# Patient Record
Sex: Male | Born: 1980 | Race: Black or African American | Hispanic: No | Marital: Married | Smoking: Never smoker
Health system: Southern US, Community
[De-identification: ages and names within clinical notes are randomized; demographics above are authoritative.]

## PROBLEM LIST (undated history)

## (undated) HISTORY — PX: ROTATOR CUFF REPAIR: SHX139

---

## 2017-02-10 ENCOUNTER — Emergency Department: Payer: BLUE CROSS/BLUE SHIELD

## 2017-02-10 ENCOUNTER — Emergency Department
Admission: EM | Admit: 2017-02-10 | Discharge: 2017-02-10 | Disposition: A | Discharge status: Home / Self Care | Payer: BLUE CROSS/BLUE SHIELD | Attending: Student in an Organized Health Care Education/Training Program | Admitting: Student in an Organized Health Care Education/Training Program

## 2017-02-10 DIAGNOSIS — G44319 Acute post-traumatic headache, not intractable: Secondary | ICD-10-CM | POA: Diagnosis not present

## 2017-02-10 DIAGNOSIS — M542 Cervicalgia: Secondary | ICD-10-CM | POA: Diagnosis not present

## 2017-02-10 DIAGNOSIS — Y929 Unspecified place or not applicable: Secondary | ICD-10-CM | POA: Diagnosis not present

## 2017-02-10 DIAGNOSIS — S59911A Unspecified injury of right forearm, initial encounter: Secondary | ICD-10-CM | POA: Diagnosis present

## 2017-02-10 DIAGNOSIS — M791 Myalgia: Secondary | ICD-10-CM | POA: Insufficient documentation

## 2017-02-10 DIAGNOSIS — Y9389 Activity, other specified: Secondary | ICD-10-CM | POA: Diagnosis not present

## 2017-02-10 DIAGNOSIS — Y999 Unspecified external cause status: Secondary | ICD-10-CM | POA: Diagnosis not present

## 2017-02-10 DIAGNOSIS — M7918 Myalgia, other site: Secondary | ICD-10-CM

## 2017-02-10 DIAGNOSIS — S52124A Nondisplaced fracture of head of right radius, initial encounter for closed fracture: Secondary | ICD-10-CM | POA: Diagnosis not present

## 2017-02-10 MED ORDER — BUTALBITAL-APAP-CAFFEINE 50-325-40 MG PO TABS: 1.0000 | ORAL_TABLET | Freq: Four times a day (QID) | ORAL | 0 refills | Status: DC | PRN

## 2017-02-10 MED ORDER — BUTALBITAL-APAP-CAFFEINE 50-325-40 MG PO TABS
ORAL_TABLET | ORAL | Status: AC
  Administered 2017-02-10: 2 via ORAL
  Filled 2017-02-10: qty 2

## 2017-02-10 MED ORDER — PROCHLORPERAZINE EDISYLATE 5 MG/ML IJ SOLN
10.0000 mg | Freq: Once | INTRAMUSCULAR | Status: AC
  Administered 2017-02-10: 10 mg via INTRAMUSCULAR
  Filled 2017-02-10: qty 2

## 2017-02-10 MED ORDER — PROCHLORPERAZINE EDISYLATE 5 MG/ML IJ SOLN
INTRAMUSCULAR | Status: AC
  Administered 2017-02-10: 10 mg via INTRAMUSCULAR
  Filled 2017-02-10: qty 2

## 2017-02-10 MED ORDER — OXYCODONE HCL 5 MG PO TABS: 5.0000 mg | ORAL_TABLET | Freq: Three times a day (TID) | ORAL | 0 refills | Status: AC | PRN

## 2017-02-10 MED ORDER — BUTALBITAL-APAP-CAFFEINE 50-325-40 MG PO TABS
2.0000 | ORAL_TABLET | ORAL | Status: DC | PRN
  Administered 2017-02-10: 2 via ORAL

## 2017-02-10 MED ORDER — CYCLOBENZAPRINE HCL 10 MG PO TABS: 10.0000 mg | ORAL_TABLET | Freq: Three times a day (TID) | ORAL | 0 refills | Status: DC | PRN

## 2017-02-10 NOTE — ED Notes (Signed)
X-ray at bedside
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
## 2017-02-10 NOTE — ED Notes (Signed)
Patient transported to CT 

## 2017-03-11 ENCOUNTER — Encounter: Payer: Self-pay | Admitting: Emergency Medicine

## 2017-03-11 ENCOUNTER — Emergency Department
Admission: EM | Admit: 2017-03-11 | Discharge: 2017-03-11 | Disposition: A | Discharge status: Home / Self Care | Payer: BLUE CROSS/BLUE SHIELD | Attending: Emergency Medicine | Admitting: Emergency Medicine

## 2017-03-11 DIAGNOSIS — F0781 Postconcussional syndrome: Secondary | ICD-10-CM

## 2017-03-11 DIAGNOSIS — R51 Headache: Secondary | ICD-10-CM | POA: Diagnosis present

## 2017-03-11 MED ORDER — KETOROLAC TROMETHAMINE 10 MG PO TABS: 10.0000 mg | ORAL_TABLET | Freq: Three times a day (TID) | ORAL | 0 refills | Status: AC

## 2017-03-11 MED ORDER — SUMATRIPTAN SUCCINATE 6 MG/0.5ML ~~LOC~~ SOLN
6.0000 mg | Freq: Once | SUBCUTANEOUS | Status: AC
  Administered 2017-03-11: 6 mg via SUBCUTANEOUS
  Filled 2017-03-11: qty 0.5

## 2017-03-11 MED ORDER — CYCLOBENZAPRINE HCL 5 MG PO TABS: 5.0000 mg | ORAL_TABLET | Freq: Three times a day (TID) | ORAL | 0 refills | Status: AC | PRN

## 2017-03-11 MED ORDER — BUTALBITAL-APAP-CAFFEINE 50-325-40 MG PO TABS: 1.0000 | ORAL_TABLET | Freq: Three times a day (TID) | ORAL | 0 refills | Status: AC | PRN

## 2017-03-11 MED ORDER — METOCLOPRAMIDE HCL 5 MG PO TABS: 5.0000 mg | ORAL_TABLET | Freq: Three times a day (TID) | ORAL | 0 refills | Status: AC | PRN

## 2017-03-11 MED ORDER — KETOROLAC TROMETHAMINE 30 MG/ML IJ SOLN
30.0000 mg | Freq: Once | INTRAMUSCULAR | Status: AC
  Administered 2017-03-11: 30 mg via INTRAMUSCULAR
  Filled 2017-03-11: qty 1

## 2017-03-11 MED ORDER — METOCLOPRAMIDE HCL 10 MG PO TABS
10.0000 mg | ORAL_TABLET | Freq: Once | ORAL | Status: AC
  Administered 2017-03-11: 10 mg via ORAL
  Filled 2017-03-11: qty 1
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
## 2017-03-11 NOTE — Discharge Instructions (Signed)
You appear to have symptoms of a post-concussive syndrome. Take prescription medications as directed and follow-up with your neurologist provider as referred. Return to ED as needed.
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
## 2021-07-30 ENCOUNTER — Emergency Department (HOSPITAL_COMMUNITY): Payer: No Typology Code available for payment source

## 2021-07-30 ENCOUNTER — Other Ambulatory Visit: Payer: Self-pay

## 2021-07-30 ENCOUNTER — Emergency Department (HOSPITAL_COMMUNITY)
Admission: EM | Admit: 2021-07-30 | Discharge: 2021-07-30 | Disposition: A | Discharge status: Home / Self Care | Payer: No Typology Code available for payment source | Attending: Emergency Medicine | Admitting: Emergency Medicine

## 2021-07-30 ENCOUNTER — Encounter (HOSPITAL_COMMUNITY): Payer: Self-pay | Admitting: Emergency Medicine

## 2021-07-30 DIAGNOSIS — R0602 Shortness of breath: Secondary | ICD-10-CM | POA: Diagnosis not present

## 2021-07-30 DIAGNOSIS — R5383 Other fatigue: Secondary | ICD-10-CM | POA: Diagnosis not present

## 2021-07-30 DIAGNOSIS — R109 Unspecified abdominal pain: Secondary | ICD-10-CM | POA: Diagnosis not present

## 2021-07-30 DIAGNOSIS — Z20822 Contact with and (suspected) exposure to covid-19: Secondary | ICD-10-CM | POA: Insufficient documentation

## 2021-07-30 DIAGNOSIS — R6883 Chills (without fever): Secondary | ICD-10-CM

## 2021-07-30 DIAGNOSIS — R509 Fever, unspecified: Secondary | ICD-10-CM | POA: Insufficient documentation

## 2021-07-30 DIAGNOSIS — R531 Weakness: Secondary | ICD-10-CM | POA: Diagnosis not present

## 2021-07-30 DIAGNOSIS — R197 Diarrhea, unspecified: Secondary | ICD-10-CM | POA: Diagnosis not present

## 2021-07-30 DIAGNOSIS — R112 Nausea with vomiting, unspecified: Secondary | ICD-10-CM | POA: Insufficient documentation

## 2021-07-30 LAB — CBC WITH DIFFERENTIAL/PLATELET
Abs Immature Granulocytes: 0.04 10*3/uL (ref 0.00–0.07)
Basophils Absolute: 0 10*3/uL (ref 0.0–0.1)
Basophils Relative: 0 %
Eosinophils Absolute: 0 10*3/uL (ref 0.0–0.5)
Eosinophils Relative: 0 %
HCT: 43.7 % (ref 39.0–52.0)
Hemoglobin: 14.3 g/dL (ref 13.0–17.0)
Immature Granulocytes: 0 %
Lymphocytes Relative: 14 %
Lymphs Abs: 1.8 10*3/uL (ref 0.7–4.0)
MCH: 28 pg (ref 26.0–34.0)
MCHC: 32.7 g/dL (ref 30.0–36.0)
MCV: 85.5 fL (ref 80.0–100.0)
Monocytes Absolute: 0.5 10*3/uL (ref 0.1–1.0)
Monocytes Relative: 4 %
Neutro Abs: 10.1 10*3/uL — ABNORMAL HIGH (ref 1.7–7.7)
Neutrophils Relative %: 82 %
Platelets: 291 10*3/uL (ref 150–400)
RBC: 5.11 MIL/uL (ref 4.22–5.81)
RDW: 15 % (ref 11.5–15.5)
WBC: 12.5 10*3/uL — ABNORMAL HIGH (ref 4.0–10.5)
nRBC: 0 % (ref 0.0–0.2)

## 2021-07-30 LAB — TROPONIN I (HIGH SENSITIVITY): Troponin I (High Sensitivity): 3 ng/L (ref <18)

## 2021-07-30 LAB — COMPREHENSIVE METABOLIC PANEL
ALT: 33 U/L (ref 0–44)
AST: 31 U/L (ref 15–41)
Albumin: 3.9 g/dL (ref 3.5–5.0)
Alkaline Phosphatase: 54 U/L (ref 38–126)
Anion gap: 7 (ref 5–15)
BUN: 8 mg/dL (ref 6–20)
CO2: 25 mmol/L (ref 22–32)
Calcium: 8.9 mg/dL (ref 8.9–10.3)
Chloride: 104 mmol/L (ref 98–111)
Creatinine, Ser: 0.89 mg/dL (ref 0.61–1.24)
GFR, Estimated: >60 mL/min (ref >60)
Glucose, Bld: 129 mg/dL — ABNORMAL HIGH (ref 70–99)
Potassium: 3.4 mmol/L — ABNORMAL LOW (ref 3.5–5.1)
Sodium: 136 mmol/L (ref 135–145)
Total Bilirubin: 0.5 mg/dL (ref 0.3–1.2)
Total Protein: 7.7 g/dL (ref 6.5–8.1)

## 2021-07-30 LAB — MAGNESIUM: Magnesium: 1.8 mg/dL (ref 1.7–2.4)

## 2021-07-30 LAB — RESP PANEL BY RT-PCR (FLU A&B, COVID) ARPGX2
Influenza A by PCR: NEGATIVE
Influenza B by PCR: NEGATIVE
SARS Coronavirus 2 by RT PCR: NEGATIVE

## 2021-07-30 LAB — PROTIME-INR
INR: 1 (ref 0.8–1.2)
Prothrombin Time: 13 seconds (ref 11.4–15.2)

## 2021-07-30 LAB — LIPASE, BLOOD: Lipase: 31 U/L (ref 11–51)

## 2021-07-30 LAB — BRAIN NATRIURETIC PEPTIDE: B Natriuretic Peptide: 56.1 pg/mL (ref 0.0–100.0)

## 2021-07-30 MED ORDER — METOCLOPRAMIDE HCL 5 MG/ML IJ SOLN
10.0000 mg | Freq: Once | INTRAMUSCULAR | Status: AC
  Administered 2021-07-30: 10 mg via INTRAVENOUS
  Filled 2021-07-30: qty 2

## 2021-07-30 MED ORDER — PANTOPRAZOLE SODIUM 40 MG IV SOLR
40.0000 mg | Freq: Once | INTRAVENOUS | Status: AC
  Administered 2021-07-30: 40 mg via INTRAVENOUS
  Filled 2021-07-30: qty 10

## 2021-07-30 MED ORDER — IOHEXOL 350 MG/ML SOLN
100.0000 mL | Freq: Once | INTRAVENOUS | Status: AC | PRN
  Administered 2021-07-30: 100 mL via INTRAVENOUS

## 2021-07-30 MED ORDER — ONDANSETRON 4 MG PO TBDP: 4.0000 mg | ORAL_TABLET | Freq: Three times a day (TID) | ORAL | 0 refills | Status: AC | PRN

## 2021-07-30 MED ORDER — LACTATED RINGERS IV BOLUS
1000.0000 mL | Freq: Once | INTRAVENOUS | Status: AC
  Administered 2021-07-30: 1000 mL via INTRAVENOUS

## 2021-07-30 MED ORDER — AZITHROMYCIN 250 MG PO TABS: 250.0000 mg | ORAL_TABLET | Freq: Every day | ORAL | 0 refills | Status: AC

## 2021-07-30 MED ORDER — HYDROMORPHONE HCL 1 MG/ML IJ SOLN
0.5000 mg | Freq: Once | INTRAMUSCULAR | Status: AC
  Administered 2021-07-30: 0.5 mg via INTRAVENOUS
  Filled 2021-07-30: qty 1

## 2021-07-30 NOTE — Discharge Instructions (Signed)
There is a prescription for an antibiotic called azithromycin to treat an atypical pneumonia that was sent to your pharmacy.  Take this as prescribed.  Continue to drink plenty of fluids to replace any losses that you have from diarrhea or vomiting.  If you do develop more nausea and vomiting, there is a medicine that was also prescribed called Zofran.  This is a medication to take as needed for nausea.  If you develop severe or uncontrolled symptoms, please return to the emergency department.

## 2021-07-30 NOTE — ED Triage Notes (Signed)
Patient presents from urgent care due to 2 days of chills, nausea, vomiting, fever and chills. He was given 50 of phenergan and 500 cc of normal saline.     EMS vitals: 134/72 BP 60 HR 105 CBG 99% SPO2 on room air

## 2021-07-30 NOTE — ED Provider Notes (Signed)
[facility]-EMERGENCY DEPT Provider Note   CSN: [id number] Arrival date & time: 07/30/21  2016     History  Chief Complaint  Patient presents with   Emesis   Nausea   Fever   Weakness   Chills    Henry Knox is a 41 y.o. male.  HPI Patient presents for abdominal pain, nausea, vomiting, and hematemesis.  Onset of symptoms was yesterday.  The episode of hematemesis was this evening.  Patient also endorses generalized body aches and shortness of breath.  He initially went to urgent care.  At urgent care, he was given Phenergan and IV fluids.  He was sent to the emergency department for further evaluation.  Patient endorses continued nausea.  Abdominal pain is located in epigastrium.  He drinks occasionally and last alcoholic beverage was on Friday.  He denies frequent use of NSAIDs.    Home Medications Prior to Admission medications   Medication Sig Start Date End Date Taking? Authorizing Provider  azithromycin (ZITHROMAX) 250 MG tablet Take 1 tablet (250 mg total) by mouth daily. Take first 2 tablets together on day 1, then 1 every day until finished. 07/30/21  Yes Gloris Manchester, MD  ondansetron (ZOFRAN-ODT) 4 MG disintegrating tablet Take 1 tablet (4 mg total) by mouth every 8 (eight) hours as needed for up to 14 doses for nausea or vomiting. 07/30/21  Yes Gloris Manchester, MD  cyclobenzaprine (FLEXERIL) 5 MG tablet Take 1 tablet (5 mg total) by mouth 3 (three) times daily as needed for muscle spasms. 03/11/17   Menshew, Charlesetta Ivory, PA-C  ketorolac (TORADOL) 10 MG tablet Take 1 tablet (10 mg total) by mouth every 8 (eight) hours. 03/11/17   Menshew, Charlesetta Ivory, PA-C  metoCLOPramide (REGLAN) 5 MG tablet Take 1 tablet (5 mg total) by mouth every 8 (eight) hours as needed for nausea or vomiting. 03/11/17   Menshew, Charlesetta Ivory, PA-C      Allergies    Patient has no known allergies.    Review of Systems   Review of Systems  Constitutional:  Positive for  chills, fatigue and fever.  Respiratory:  Positive for cough and shortness of breath.   Gastrointestinal:  Positive for abdominal pain, diarrhea, nausea and vomiting.  All other systems reviewed and are negative.  Physical Exam Updated Vital Signs BP (!) 123/49    Pulse 62    Temp 98.4 F (36.9 C) (Oral)    Resp 18    SpO2 94%  Physical Exam Vitals and nursing note reviewed.  Constitutional:      General: He is not in acute distress.    Appearance: Normal appearance. He is well-developed. He is not toxic-appearing or diaphoretic.  HENT:     Head: Normocephalic and atraumatic.     Right Ear: External ear normal.     Left Ear: External ear normal.     Nose: Nose normal.     Mouth/Throat:     Mouth: Mucous membranes are moist.     Pharynx: Oropharynx is clear.  Eyes:     General: No scleral icterus.    Extraocular Movements: Extraocular movements intact.     Conjunctiva/sclera: Conjunctivae normal.  Cardiovascular:     Rate and Rhythm: Normal rate and regular rhythm.     Heart sounds: No murmur heard. Pulmonary:     Effort: Pulmonary effort is normal. No respiratory distress.     Breath sounds: Normal breath sounds. No wheezing or rales.  Abdominal:  Palpations: Abdomen is soft.     Tenderness: There is no abdominal tenderness.  Musculoskeletal:        General: No swelling. Normal range of motion.     Cervical back: Normal range of motion and neck supple.     Right lower leg: No edema.     Left lower leg: No edema.  Skin:    General: Skin is warm and dry.     Capillary Refill: Capillary refill takes less than 2 seconds.     Coloration: Skin is not jaundiced or pale.  Neurological:     General: No focal deficit present.     Mental Status: He is alert and oriented to person, place, and time.     Cranial Nerves: No cranial nerve deficit.     Sensory: No sensory deficit.     Motor: No weakness.     Coordination: Coordination normal.  Psychiatric:        Mood and  Affect: Mood normal.        Behavior: Behavior normal.        Thought Content: Thought content normal.        Judgment: Judgment normal.    ED Results / Procedures / Treatments   Labs (all labs ordered are listed, but only abnormal results are displayed) Labs Reviewed  COMPREHENSIVE METABOLIC PANEL - Abnormal; Notable for the following components:      Result Value   Potassium 3.4 (*)    Glucose, Bld 129 (*)    All other components within normal limits  CBC WITH DIFFERENTIAL/PLATELET - Abnormal; Notable for the following components:   WBC 12.5 (*)    Neutro Abs 10.1 (*)    All other components within normal limits  RESP PANEL BY RT-PCR (FLU A&B, COVID) ARPGX2  LIPASE, BLOOD  MAGNESIUM  PROTIME-INR  BRAIN NATRIURETIC PEPTIDE  URINALYSIS, ROUTINE W REFLEX MICROSCOPIC  TROPONIN I (HIGH SENSITIVITY)    EKG EKG Interpretation  Date/Time:  Monday July 30 2021 20:47:01 EST Ventricular Rate:  58 PR Interval:  142 QRS Duration: 83 QT Interval:  398 QTC Calculation: 391 R Axis:   44 Text Interpretation: Sinus rhythm Baseline wander in lead(s) I III aVR aVL V2 V6 Confirmed by Gloris Manchester ([id number]) on 07/30/2021 9:24:21 PM  Radiology CT Angio Chest PE W and/or Wo Contrast  Result Date: 07/30/2021 CLINICAL DATA:  Fever and chills x2 days. EXAM: CT ANGIOGRAPHY CHEST WITH CONTRAST TECHNIQUE: Multidetector CT imaging of the chest was performed using the standard protocol during bolus administration of intravenous contrast. Multiplanar CT image reconstructions and MIPs were obtained to evaluate the vascular anatomy. RADIATION DOSE REDUCTION: This exam was performed according to the departmental dose-optimization program which includes automated exposure control, adjustment of the mA and/or kV according to patient size and/or use of iterative reconstruction technique. CONTRAST:  OMNIPAQUE IOHEXOL 350 MG/ML SOLN COMPARISON:  None. FINDINGS: Cardiovascular: Satisfactory opacification of  the pulmonary arteries to the segmental level. No evidence of pulmonary embolism. Normal heart size. No pericardial effusion. Mediastinum/Nodes: No enlarged mediastinal, hilar, or axillary lymph nodes. Thyroid gland, trachea, and esophagus demonstrate no significant findings. Lungs/Pleura: Very mild atelectasis and/or early infiltrate is seen within the posterior aspect of the right lower lobe. There is no evidence of a pleural effusion or pneumothorax. Upper Abdomen: No acute abnormality. Musculoskeletal: No chest wall abnormality. No acute or significant osseous findings. Review of the MIP images confirms the above findings. IMPRESSION: 1. Very mild posterior right lower lobe atelectasis and/or  early infiltrate. 2. No evidence of pulmonary embolism. Electronically Signed   By: Aram Candelahaddeus  Houston M.D.   On: 07/30/2021 21:56   DG Chest Portable 1 View  Result Date: 07/30/2021 CLINICAL DATA:  Shortness of breath, nausea and vomiting. EXAM: PORTABLE CHEST 1 VIEW COMPARISON:  Chest x-ray dated 02/10/2017 FINDINGS: Study is hypoinspiratory. Bilateral perihilar airspace opacities and bibasilar opacities, LEFT greater than RIGHT, with distribution most suggestive of pulmonary edema. Lungs otherwise clear. No pleural effusion or pneumothorax is seen. Heart size and mediastinal contours are grossly stable. IMPRESSION: Low lung volumes. Probable central pulmonary vascular congestion and bilateral perihilar/basilar pulmonary edema suggesting CHF/volume overload. If febrile, could not exclude a multifocal pneumonia. Electronically Signed   By: Bary RichardStan  Maynard M.D.   On: 07/30/2021 21:11    Procedures Procedures    Medications Ordered in ED Medications  metoCLOPramide (REGLAN) injection 10 mg (10 mg Intravenous Given 07/30/21 2038)  HYDROmorphone (DILAUDID) injection 0.5 mg (0.5 mg Intravenous Given 07/30/21 2039)  lactated ringers bolus 1,000 mL (0 mLs Intravenous Stopped 07/30/21 2311)  pantoprazole (PROTONIX)  injection 40 mg (40 mg Intravenous Given 07/30/21 2137)  iohexol (OMNIPAQUE) 350 MG/ML injection 100 mL (100 mLs Intravenous Contrast Given 07/30/21 2145)    ED Course/ Medical Decision Making/ A&P                           Medical Decision Making Amount and/or Complexity of Data Reviewed Labs: ordered. Radiology: ordered.  Risk Prescription drug management.   This patient presents to the ED for concern of vomiting and diarrhea, this involves an extensive number of treatment options, and is a complaint that carries with it a high risk of complications and morbidity.  The differential diagnosis includes enteritis, pneumonia, URI, cyclic vomiting, intoxication, withdrawal   Co morbidities that complicate the patient evaluation  N/A   Additional history obtained:  Additional history obtained from patient's wife External records from outside source obtained and reviewed including EMR, paperwork from urgent care   Lab Tests:  I Ordered, and personally interpreted labs.  The pertinent results include: Leukocytosis, otherwise normal findings   Imaging Studies ordered:  I ordered imaging studies including chest x-ray, CTA chest I independently visualized and interpreted imaging which showed mild posterior right lower lobe atelectasis versus infiltrate I agree with the radiologist interpretation   Cardiac Monitoring:  The patient was maintained on a cardiac monitor.  I personally viewed and interpreted the cardiac monitored which showed an underlying rhythm of: Sinus rhythm   Medicines ordered and prescription drug management:  I ordered medication including IV fluids, for fluid losses; Reglan and Dilaudid for symptomatic relief, Protonix for empiric treatment of UGIB Reevaluation of the patient after these medicines showed that the patient resolved I have reviewed the patients home medicines and have made adjustments as needed   Test Considered:  CT of abdomen, deferred  given absence of tenderness and patient's resolution of symptoms while in the ED  Problem List / ED Course:  Healthy 41 year old male presenting for flulike symptoms for the past 2 days, including nausea, vomiting, p.o. intolerance, and diarrhea.  He had an episode of hematemesis earlier this evening.  He presented to urgent care and was subsequently transferred to the emergency department by ambulance.  On arrival, patient appears uncomfortable but is overall well-appearing.  His breathing is unlabored and his vital signs are normal.  Laboratory work-up was initiated.  Patient was given Reglan and Dilaudid for symptomatic  relief.  He was given IV fluids due to fluid losses.  Given concern of upper GI bleed, patient was given Protonix.  Patient denies any frequent NSAID use.  He does medical marijuana but denies any other illicit drug use.  He drinks occasionally and has not had any alcoholic beverages in the past 4 days.  Laboratory work-up was unremarkable.  He does have a slight leukocytosis which is expected in the setting of stress demargination from frequent vomiting.  On imaging studies there is a small area in the right lung lower lobe that is possible early pneumonia.  I did speak with the patient about this.  Patient does state that he would like an antibiotic for empiric treatment of pneumonia.  Patient also to be prescribed Zofran for as needed relief of further episodes of nausea.  He had no further episodes of vomiting or diarrhea in the ED.  I suspect that his hematemesis was from a Mallory-Weiss tear.  While in the ED, he actually had complete resolution of his symptoms.  Given this resolution, I do not feel that any further testing is indicated.  Patient was discharged in good condition.   Reevaluation:  After the interventions noted above, I reevaluated the patient and found that they have :resolved  Dispostion:  After consideration of the diagnostic results and the patients response  to treatment, I feel that the patent would benefit from discharge.          Final Clinical Impression(s) / ED Diagnoses Final diagnoses:  Chills  Nausea and vomiting, unspecified vomiting type  Shortness of breath    Rx / DC Orders ED Discharge Orders          Ordered    azithromycin (ZITHROMAX) 250 MG tablet  Daily        07/30/21 2300    ondansetron (ZOFRAN-ODT) 4 MG disintegrating tablet  Every 8 hours PRN        07/30/21 2300              Gloris Manchester, MD 07/31/21 0130

## 2022-12-08 IMAGING — CT CT ANGIO CHEST
3 of 7 series · 17 of 36 positions shown · IV contrast (OMNIPAQUE 350)
Comparison: None.

CLINICAL DATA: Fever and chills x2 days.

EXAM:
CT ANGIOGRAPHY CHEST WITH CONTRAST
TECHNIQUE: Multidetector CT imaging of the chest was performed using the
standard protocol during bolus administration of intravenous
contrast. Multiplanar CT image reconstructions and MIPs were
obtained to evaluate the vascular anatomy.

[Series 5: thins · axial · 0.66mm/px · z∈[-265,-45]mm · 12 of 262 slices shown]
[im 21/262  lung]
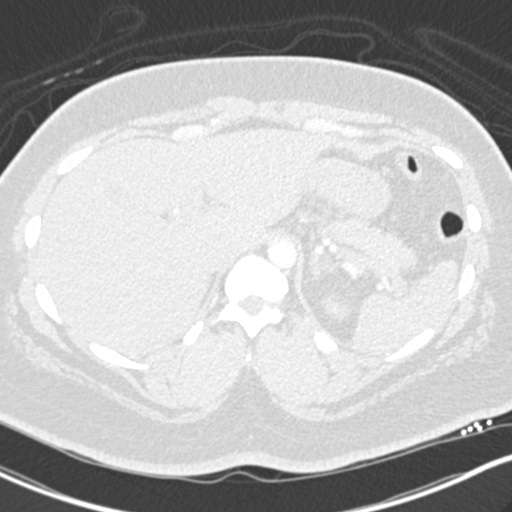
[im 41/262  mediastinal]
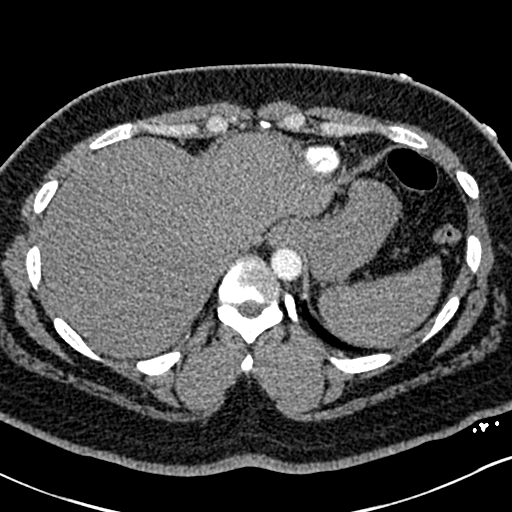
[im 61/262  lung]
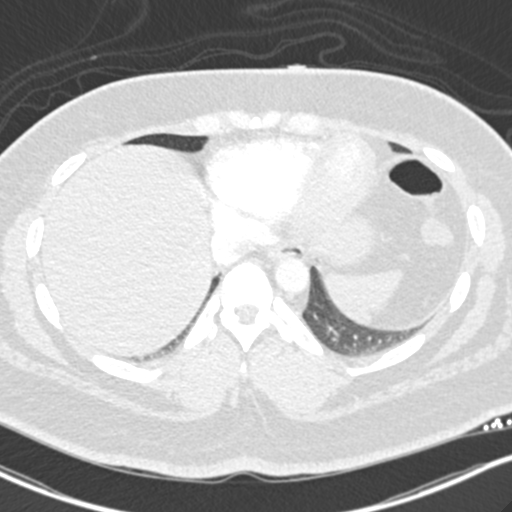
[im 81/262  mediastinal]
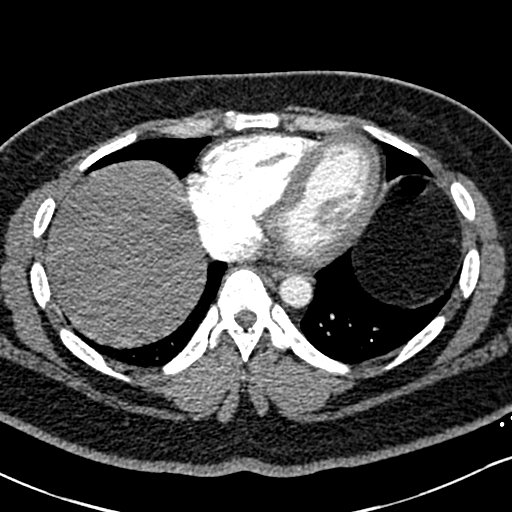
[im 101/262  lung]
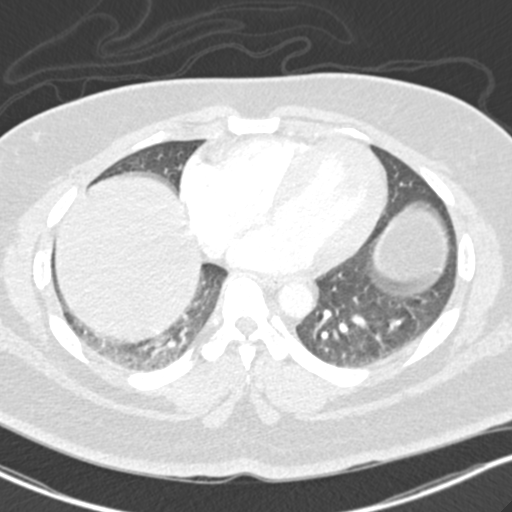
[im 121/262  mediastinal]
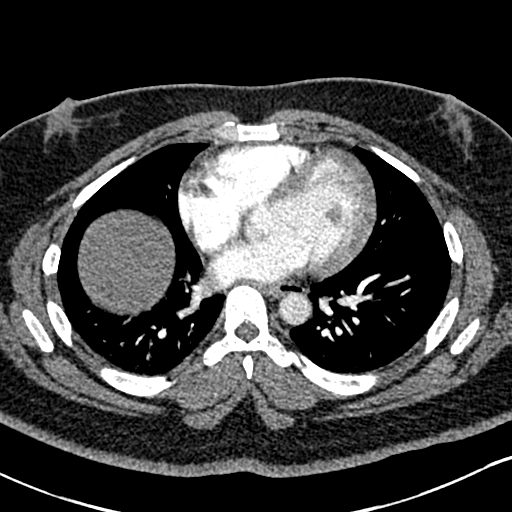
[im 141/262  lung]
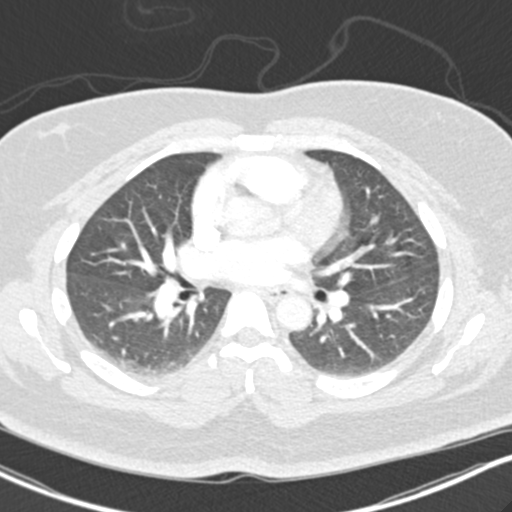
[im 161/262  mediastinal]
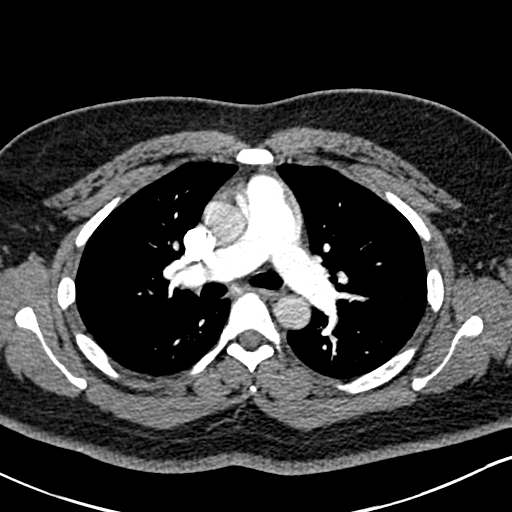
[im 181/262  lung]
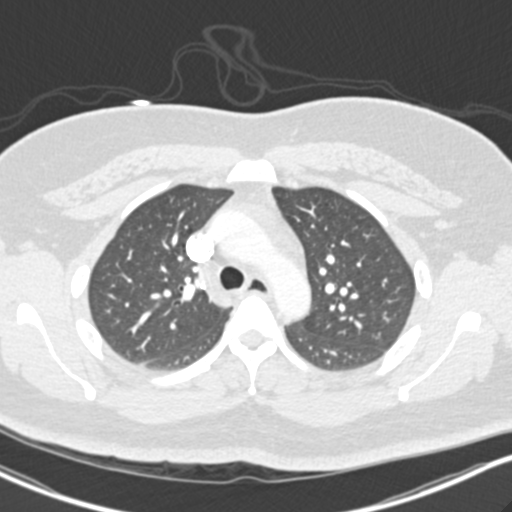
[im 201/262  mediastinal]
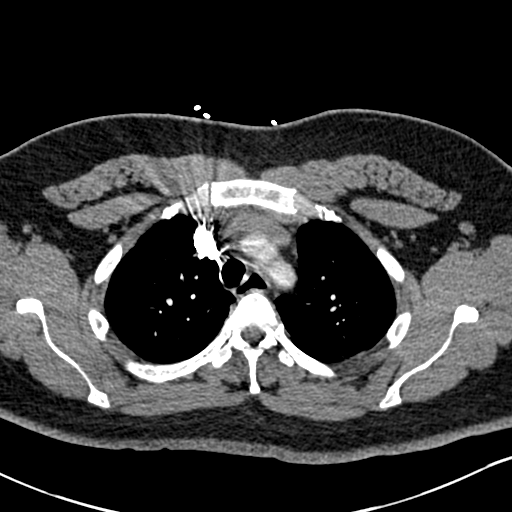
[im 221/262  lung]
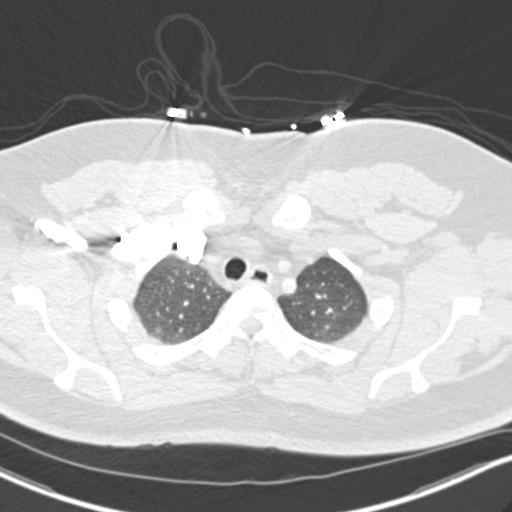
[im 241/262  mediastinal]
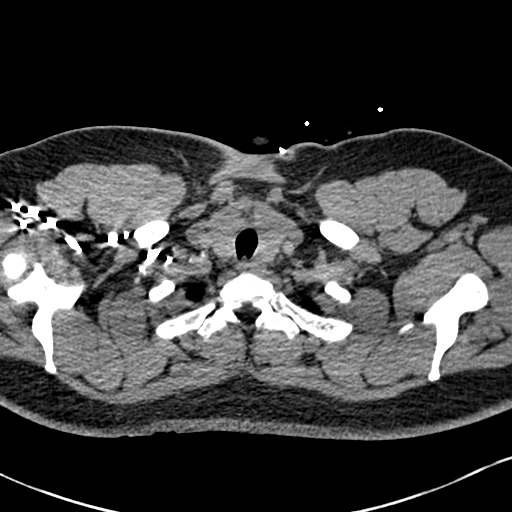

[Series 6: coronal mpr · coronal · 0.51mm/px · 1 of 134 slices shown]
[im 67/134  mediastinal]
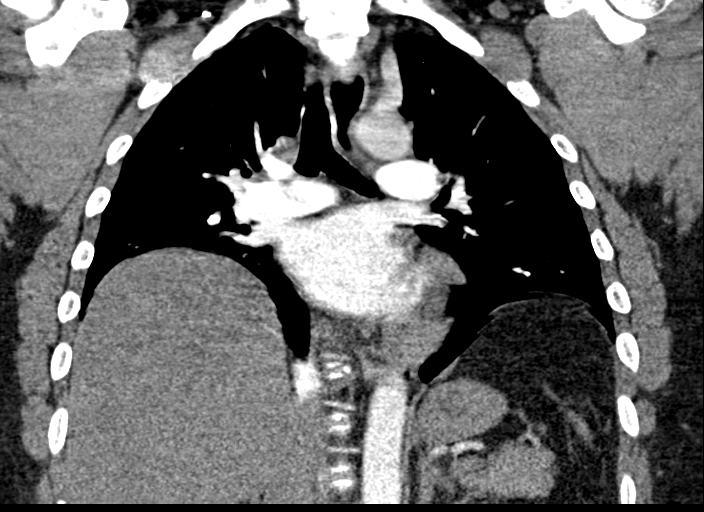

[Series 10: lung · axial · 0.61mm/px · z∈[-206,-70]mm · 4 of 114 slices shown]
[im 23/114  mediastinal]
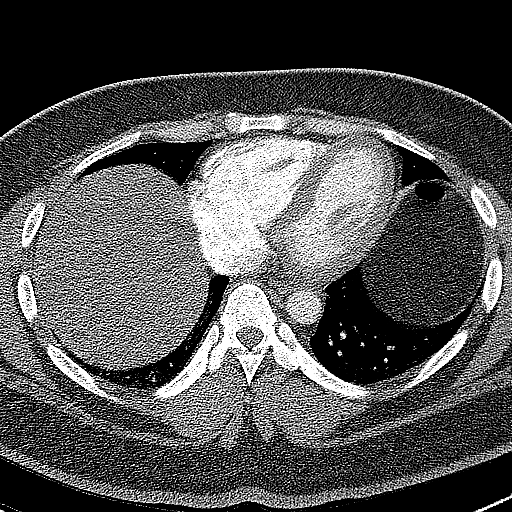
[im 46/114  mediastinal]
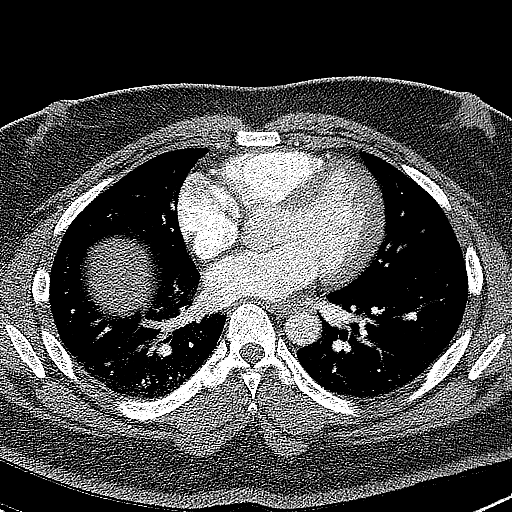
[im 68/114  mediastinal]
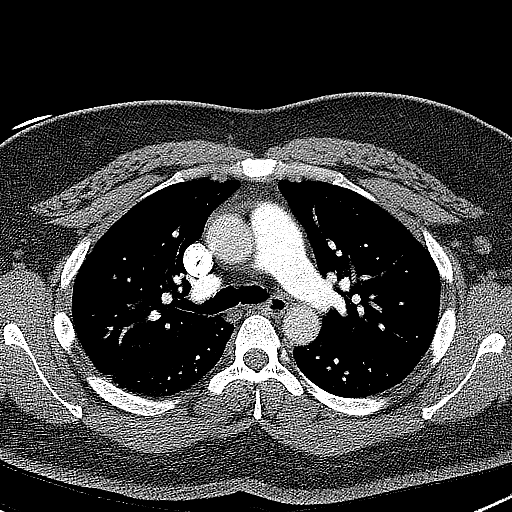
[im 91/114  mediastinal]
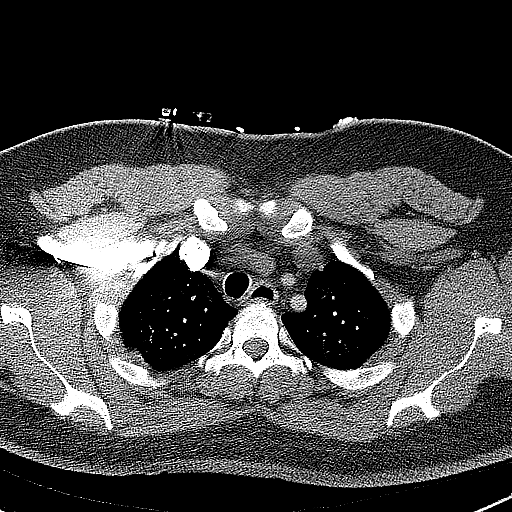

[17 of 36 positions shown; findings below may reference images not displayed]

RADIATION DOSE REDUCTION: This exam was performed according to the
departmental dose-optimization program which includes automated
exposure control, adjustment of the mA and/or kV according to
patient size and/or use of iterative reconstruction technique.

CONTRAST:  100mL OMNIPAQUE IOHEXOL 350 MG/ML SOLN
FINDINGS: Cardiovascular: Satisfactory opacification of the pulmonary arteries
to the segmental level. No evidence of pulmonary embolism. Normal
heart size. No pericardial effusion.

Mediastinum/Nodes: No enlarged mediastinal, hilar, or axillary lymph
nodes. Thyroid gland, trachea, and esophagus demonstrate no
significant findings.

Lungs/Pleura: Very mild atelectasis and/or early infiltrate is seen
within the posterior aspect of the right lower lobe.

There is no evidence of a pleural effusion or pneumothorax.

Upper Abdomen: No acute abnormality.

Musculoskeletal: No chest wall abnormality. No acute or significant
osseous findings.

Review of the MIP images confirms the above findings.
IMPRESSION: 1. Very mild posterior right lower lobe atelectasis and/or early
infiltrate.
2. No evidence of pulmonary embolism.
